# Patient Record
Sex: Female | Born: 1960 | Race: White | Hispanic: No | Marital: Married | Smoking: Never smoker
Health system: Southern US, Community
[De-identification: ages and names within clinical notes are randomized; demographics above are authoritative.]

## PROBLEM LIST (undated history)

## (undated) DIAGNOSIS — I871 Compression of vein: Secondary | ICD-10-CM

## (undated) DIAGNOSIS — Q211 Atrial septal defect: Secondary | ICD-10-CM

## (undated) DIAGNOSIS — F3181 Bipolar II disorder: Secondary | ICD-10-CM

## (undated) HISTORY — DX: Bipolar II disorder: F31.81

## (undated) HISTORY — DX: Compression of vein: I87.1

## (undated) HISTORY — PX: ABDOMINAL HYSTERECTOMY: SHX81

## (undated) HISTORY — DX: Atrial septal defect: Q21.1

---

## 2010-11-03 LAB — HM COLONOSCOPY

## 2017-04-05 ENCOUNTER — Ambulatory Visit: Payer: Self-pay | Admitting: Physician Assistant

## 2017-04-08 ENCOUNTER — Ambulatory Visit (INDEPENDENT_AMBULATORY_CARE_PROVIDER_SITE_OTHER): Payer: BLUE CROSS/BLUE SHIELD

## 2017-04-08 ENCOUNTER — Encounter: Payer: Self-pay | Admitting: Physician Assistant

## 2017-04-08 ENCOUNTER — Ambulatory Visit (INDEPENDENT_AMBULATORY_CARE_PROVIDER_SITE_OTHER): Payer: BLUE CROSS/BLUE SHIELD | Admitting: Physician Assistant

## 2017-04-08 VITALS — BP 114/78 | HR 68 | Ht 61.0 in | Wt 136.0 lb

## 2017-04-08 DIAGNOSIS — F3181 Bipolar II disorder: Secondary | ICD-10-CM | POA: Diagnosis not present

## 2017-04-08 DIAGNOSIS — Q211 Atrial septal defect, unspecified: Secondary | ICD-10-CM

## 2017-04-08 DIAGNOSIS — K769 Liver disease, unspecified: Secondary | ICD-10-CM | POA: Diagnosis not present

## 2017-04-08 DIAGNOSIS — Z7689 Persons encountering health services in other specified circumstances: Secondary | ICD-10-CM

## 2017-04-08 DIAGNOSIS — K7689 Other specified diseases of liver: Secondary | ICD-10-CM

## 2017-04-08 DIAGNOSIS — R109 Unspecified abdominal pain: Secondary | ICD-10-CM

## 2017-04-08 DIAGNOSIS — I871 Compression of vein: Secondary | ICD-10-CM

## 2017-04-08 HISTORY — DX: Atrial septal defect: Q21.1

## 2017-04-08 HISTORY — DX: Atrial septal defect, unspecified: Q21.10

## 2017-04-08 HISTORY — DX: Compression of vein: I87.1

## 2017-04-08 LAB — POCT URINALYSIS DIPSTICK
Bilirubin, UA: NEGATIVE
GLUCOSE UA: NEGATIVE
KETONES UA: NEGATIVE
Leukocytes, UA: NEGATIVE
Nitrite, UA: NEGATIVE
Protein, UA: NEGATIVE
RBC UA: NEGATIVE
SPEC GRAV UA: 1.015 (ref 1.010–1.025)
UROBILINOGEN UA: 0.2 U/dL
pH, UA: 7 (ref 5.0–8.0)

## 2017-04-08 LAB — CBC
HCT: 40.7 % (ref 35.0–45.0)
Hemoglobin: 13 g/dL (ref 11.7–15.5)
MCH: 30.3 pg (ref 27.0–33.0)
MCHC: 31.9 g/dL — AB (ref 32.0–36.0)
MCV: 94.9 fL (ref 80.0–100.0)
MPV: 11.5 fL (ref 7.5–12.5)
Platelets: 188 10*3/uL (ref 140–400)
RBC: 4.29 MIL/uL (ref 3.80–5.10)
RDW: 13.5 % (ref 11.0–15.0)
WBC: 4.1 10*3/uL (ref 3.8–10.8)

## 2017-04-08 LAB — COMPLETE METABOLIC PANEL WITH GFR
ALT: 11 U/L (ref 6–29)
AST: 15 U/L (ref 10–35)
Albumin: 4.3 g/dL (ref 3.6–5.1)
Alkaline Phosphatase: 40 U/L (ref 33–130)
BILIRUBIN TOTAL: 1 mg/dL (ref 0.2–1.2)
BUN: 13 mg/dL (ref 7–25)
CALCIUM: 9.5 mg/dL (ref 8.6–10.4)
CHLORIDE: 104 mmol/L (ref 98–110)
CO2: 28 mmol/L (ref 20–31)
Creat: 0.91 mg/dL (ref 0.50–1.05)
GFR, EST AFRICAN AMERICAN: 83 mL/min (ref >=60)
GFR, Est Non African American: 72 mL/min (ref >=60)
Glucose, Bld: 97 mg/dL (ref 65–99)
Potassium: 4.5 mmol/L (ref 3.5–5.3)
Sodium: 141 mmol/L (ref 135–146)
TOTAL PROTEIN: 6.5 g/dL (ref 6.1–8.1)

## 2017-04-08 LAB — LIPID PANEL W/REFLEX DIRECT LDL
Cholesterol: 219 mg/dL — ABNORMAL HIGH (ref <200)
HDL: 98 mg/dL (ref >50)
LDL-Cholesterol: 103 mg/dL — ABNORMAL HIGH
NON-HDL CHOLESTEROL (CALC): 121 mg/dL (ref <130)
TRIGLYCERIDES: 89 mg/dL (ref <150)
Total CHOL/HDL Ratio: 2.2 Ratio (ref <5.0)

## 2017-04-08 MED ORDER — IOPAMIDOL (ISOVUE-300) INJECTION 61%
100.0000 mL | Freq: Once | INTRAVENOUS | Status: AC | PRN
  Administered 2017-04-08: 100 mL via INTRAVENOUS

## 2017-04-08 MED ORDER — QUETIAPINE FUMARATE 50 MG PO TABS: 50.0000 mg | ORAL_TABLET | Freq: Every day | ORAL | 3 refills | Status: DC

## 2017-04-08 MED ORDER — BUPROPION HCL ER (SR) 150 MG PO TB12: 150.0000 mg | ORAL_TABLET | Freq: Every day | ORAL | 3 refills | Status: DC

## 2017-04-08 NOTE — Progress Notes (Signed)
HPI:                                                                Carrie Ewing is a 56 y.o. female who presents to [facility]: Primary Care Sports Medicine today to establish care   Current Concerns include left-sided swelling  Patient reports asymmetric swelling on her left lower lateral abdomen/"love handle" for a few months. She states it is tender if she applies pressure, but otherwise is asymptomatic. She denies fever, chills, unintended weight loss, change in bowel or bladder habits.   Depression/Anxiety: taking Wellbutrin and Seroquel for Bipolar II without difficulty. Patient reports she has been stable on this regimen for years. Denies symptoms of mania/hypomania. Denies suicidal thinking. Denies auditory/visual hallucinations.  Renal vein stenosis: patient states she has been followed by vascular surgery for this. She has never had stenting or any procedures. Denies history of CKD.  Mammogram - UTD 10/2016 per patient Colonoscopy - UTD 2013 per patient   Health Maintenance Health Maintenance  Topic Date Due  . HIV Screening  09/24/1978  . MAMMOGRAM  09/24/2013  . COLONOSCOPY  09/24/2013  . INFLUENZA VACCINE  05/26/2017  . TETANUS/TDAP  01/26/2027  . Hepatitis C Screening  Completed    Past Medical History:  Diagnosis Date  . Atrial septal defect 04/08/2017  . Bipolar II disorder (HCC)   . Renal vein stenosis 04/08/2017   Past Surgical History:  Procedure Laterality Date  . ABDOMINAL HYSTERECTOMY     fibroids   Social History  Substance Use Topics  . Smoking status: Never Smoker  . Smokeless tobacco: Never Used  . Alcohol use Not on file   family history is not on file.  ROS: negative except as noted in the HPI  Medications: Current Outpatient Prescriptions  Medication Sig Dispense Refill  . aspirin EC 81 MG tablet Take 81 mg by mouth daily.    Marland Kitchen buPROPion (WELLBUTRIN SR) 150 MG 12 hr tablet Take 1 tablet (150 mg total) by mouth  daily. 90 tablet 3  . QUEtiapine (SEROQUEL) 50 MG tablet Take 1 tablet (50 mg total) by mouth daily. 90 tablet 3   No current facility-administered medications for this visit.    No Known Allergies   Objective:  BP 114/78   Pulse 68   Ht 5\' 1"  (1.549 m)   Wt 136 lb (61.7 kg)   BMI 25.70 kg/m  Gen: well-groomed, cooperative, not ill-appearing, no distress HEENT: normal conjunctiva, wearing glasses, EOM's intact  Pulm: Normal work of breathing, normal phonation, clear to auscultation bilaterally CV: Normal rate, regular rhythm, s1 and s2 distinct, no murmurs, clicks or rubs, no carotid bruit GI: abdomen soft, nondistended, nontender, no masses, there is a larger distribution of fat/fullness on the left waistline compared to the right, endorses tenderness when compressed, no CVA tenderness Neuro: alert and oriented x 3, normal tone, no tremor MSK: moving all extremities, normal gait and station, no peripheral edema Skin: warm and dry, no rashes or lesions on exposed skin Psych: normal affect, euthymic mood, normal speech and thought content   No results found for this or any previous visit (from the past 72 hour(s)).   Depression screen [facility] 2/9 04/08/2017  Decreased Interest 0  Down, Depressed, Hopeless 0  PHQ - 2 Score 0     Assessment and Plan: 56 y.o. female with   1. Encounter to establish care - reviewed PMH/PSH - Colonoscopy UTD per patient, requesting outside records - Mammogram UTD per patient, requesting outside records - declines Hepatitis C screening  2. Bipolar II disorder in full remission (HCC) - PHQ2 negative, denies sx of mania/hypomania. Stable - QUEtiapine (SEROQUEL) 50 MG tablet; Take 1 tablet (50 mg total) by mouth daily.  Dispense: 90 tablet; Refill: 3 - buPROPion (WELLBUTRIN SR) 150 MG 12 hr tablet; Take 1 tablet (150 mg total) by mouth daily.  Dispense: 90 tablet; Refill: 3  3. Renal vein stenosis - called and spoke with Abdominal Radiology, Dr.  Kerri PerchesPaff, who recommend CT Abdomen w/contrast to assess patient's acute left lower side pain/swelling in the setting of her renal vein stenosis - CBC - Lipid Panel w/reflex Direct LDL - COMPLETE METABOLIC PANEL WITH GFR - CT ABDOMEN W CONTRAST; Future - Referral to Vascular Surgery for ongoing management/surveillance  4. Acute left flank pain - POCT Urinalysis Dipstick negative - CT ABDOMEN W CONTRAST; Future  Patient education and anticipatory guidance given Patient agrees with treatment plan Follow-up in 3 months for CPE or sooner as needed  Levonne Hubertharley E. Britney Captain PA-C

## 2017-04-09 ENCOUNTER — Encounter: Payer: Self-pay | Admitting: Physician Assistant

## 2017-04-09 ENCOUNTER — Telehealth: Payer: Self-pay

## 2017-04-09 ENCOUNTER — Telehealth: Payer: Self-pay | Admitting: Physician Assistant

## 2017-04-09 DIAGNOSIS — E7800 Pure hypercholesterolemia, unspecified: Secondary | ICD-10-CM | POA: Insufficient documentation

## 2017-04-09 NOTE — Telephone Encounter (Signed)
Pt called stating that she did her own research on liver lesions.  She found that most have lesions on their liver.  She wants to know is it worth her money and time to have the MRI done.  She reports she will cancel MRI for Monday if she does not get a response back by this evening. Please advise. -EH/RMA

## 2017-04-09 NOTE — Progress Notes (Signed)
Your labs look good overall - normal kidney function - normal blood counts - no evidence of diabetes  Total cholesterol is a little high. Recommend diet rich in whole grain, fruit and vegetables and regular cardiovascular exercise.

## 2017-04-09 NOTE — Telephone Encounter (Signed)
Pt notified of recommendations and stated she will proceed with the MRI -EH/RMA

## 2017-04-09 NOTE — Telephone Encounter (Signed)
Called patient and discussed results. Explained there is a lesion in the left lobes of the liver. Explained that radiologist recommends a follow-up MRI of the liver with contrast. Patient expressed understanding. MRI ordered.

## 2017-04-09 NOTE — Telephone Encounter (Signed)
Please let Carrie Ewing know I understand where she is coming from with wanting to be cost-effective and not have an unnecessary imaging study. There is a chance that this liver lesion is benign. However, the radiologist would not have recommended the MRI with contrast if they didn't think this liver lesion could be something more serious, such as a cancer. My medical recommendation is two options: proceed with the MRI or I can refer her to a GI doctor who specializes in this field who could review the images with her and together they could decide on next steps.  Also, please let Carrie ReekBarb know that she should continue to be followed by a Vascular specialist at least annually for her renal vein stenosis and I have placed that referral.

## 2017-04-11 ENCOUNTER — Encounter: Payer: Self-pay | Admitting: Physician Assistant

## 2017-04-16 ENCOUNTER — Encounter: Payer: Self-pay | Admitting: Vascular Surgery

## 2017-04-16 ENCOUNTER — Encounter: Payer: Self-pay | Admitting: Physician Assistant

## 2017-04-16 ENCOUNTER — Ambulatory Visit (INDEPENDENT_AMBULATORY_CARE_PROVIDER_SITE_OTHER): Payer: BLUE CROSS/BLUE SHIELD | Admitting: Vascular Surgery

## 2017-04-16 VITALS — BP 111/74 | HR 74 | Temp 98.1°F | Resp 16 | Ht 61.0 in | Wt 133.0 lb

## 2017-04-16 DIAGNOSIS — I871 Compression of vein: Secondary | ICD-10-CM | POA: Diagnosis not present

## 2017-04-16 NOTE — Progress Notes (Signed)
Patient ID: Carrie FusiBarbara Ewing, female   DOB: 1961/10/05, 56 y.o.   MRN: [id number]  Reason for Consult: New Evaluation (left renal artery stenosis)   Referred by Donzetta Kohutummings, Charley Eliza*  Subjective:     HPI:  Carrie Ewing is a 56 y.o. female mostly healthy female with history of bipolar disorder as well as renal vein stenosis which had previously been followed by a vascular surgeon in [place]. She has never had any intervention for this and has never been symptomatic. This was found incidentally a few years back on CT scanning for lower back pain. She now has no back pain. She does not have any flank pain. She denies any times having urine in her blood and does not have any vulvar pelvic Varicosities. This time she has no complaints related to today's visit.  Past Medical History:  Diagnosis Date  . Atrial septal defect 04/08/2017  . Bipolar II disorder (HCC)   . 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 Past Surgical History:  Procedure Laterality Date  . ABDOMINAL HYSTERECTOMY     fibroids    Short Social History:  Social History  Substance Use Topics  . Smoking status: Never Smoker  . Smokeless tobacco: Never Used  . Alcohol use 0.6 oz/week    1 Glasses of wine per week     Comment: weekends    No Known Allergies  Current Outpatient Prescriptions  Medication Sig Dispense Refill  . aspirin EC 81 MG tablet Take 81 mg by mouth daily.    Marland Kitchen. buPROPion (WELLBUTRIN SR) 150 MG 12 hr tablet Take 1 tablet (150 mg total) by mouth daily. 90 tablet 3  . QUEtiapine (SEROQUEL) 50 MG tablet Take 1 tablet (50 mg total) by mouth daily. 90 tablet 3   No current facility-administered medications for this visit.     Review of Systems  Constitutional:  Constitutional negative. Eyes: Eyes negative.  Respiratory: Respiratory negative.  Cardiovascular: Cardiovascular negative.  GI: Gastrointestinal negative.  GU: Genitourinary negative. Musculoskeletal:  Positive for back pain.  Skin: Skin negative.  Hematologic: Hematologic/lymphatic negative.  Psychiatric: Positive for depressed mood.        Objective:  Objective   Vitals:   04/16/17 1126  BP: 111/74  Pulse: 74  Resp: 16  Temp: 98.1 F (36.7 C)  SpO2: 95%  Weight: 133 lb (60.3 kg)  Height: 5\' 1"  (1.549 m)   Body mass index is 25.13 kg/m.  Physical Exam  Constitutional: She appears well-developed.  HENT:  Head: Normocephalic.  Eyes: Pupils are equal, round, and reactive to light.  Neck: Normal range of motion.  Cardiovascular: Normal rate and regular rhythm.   Pulses:      Radial pulses are 2+ on the right side, and 2+ on the left side.       Popliteal pulses are 2+ on the right side, and 2+ on the left side.       Dorsalis pedis pulses are 2+ on the right side, and 2+ on the left side.       Posterior tibial pulses are 2+ on the right side, and 2+ on the left side.  Abdominal: Soft. She exhibits no mass.  Musculoskeletal: Normal range of motion. She exhibits no edema.  Neurological: She is alert.  Skin: Skin is warm and dry.  Psychiatric: She has a normal mood and affect. Her behavior is normal. Judgment and thought content normal.    Data: IMPRESSION: There is narrowing of the left  renal vein as it courses inferior to the superior mesenteric artery. There is reflux of contrast into the left gonadal vein.      Assessment/Plan:     56 year old female with a history of left renal vein stenosis by CT scan. She by all accounts appears to have never had a symptoms from this. She does have an enlarged collateral vein on the left is also asymptomatic from this. This point I cannot recommend any intervention. I have offered follow-up in 2 years with ultrasound or to just call should she ever develops symptoms of flank pain and hematuria or pelvic varicosities. She is elected to do that. We'll see her on a when necessary basis.     Maeola Harman  MD [facility]

## 2017-04-19 ENCOUNTER — Encounter: Payer: Self-pay | Admitting: Physician Assistant

## 2017-04-19 ENCOUNTER — Telehealth: Payer: Self-pay

## 2017-04-19 ENCOUNTER — Ambulatory Visit (INDEPENDENT_AMBULATORY_CARE_PROVIDER_SITE_OTHER): Payer: BLUE CROSS/BLUE SHIELD

## 2017-04-19 DIAGNOSIS — K769 Liver disease, unspecified: Secondary | ICD-10-CM

## 2017-04-19 DIAGNOSIS — K7689 Other specified diseases of liver: Secondary | ICD-10-CM

## 2017-04-19 MED ORDER — GADOXETATE DISODIUM 0.25 MMOL/ML IV SOLN
6.0000 mL | Freq: Once | INTRAVENOUS | Status: AC | PRN
  Administered 2017-04-19: 6 mL via INTRAVENOUS

## 2017-04-19 NOTE — Telephone Encounter (Signed)
Medication is correct as written. Once a day

## 2017-04-19 NOTE — Telephone Encounter (Signed)
Pt pharmacy called asking for clarification on pt wellbutrin. Pt was given SR which is a twice a day medication. Would like to know if she should be on XR or is she to take 1 tab BID. Please clarify.

## 2017-04-19 NOTE — Progress Notes (Signed)
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 This  does not have any cancer potential No further follow-up necessary

## 2017-04-20 NOTE — Telephone Encounter (Signed)
Pharmacy notified.

## 2017-04-22 ENCOUNTER — Telehealth: Payer: Self-pay | Admitting: Physician Assistant

## 2017-04-22 MED ORDER — BUPROPION HCL ER (XL) 150 MG PO TB24: 150.0000 mg | ORAL_TABLET | Freq: Every day | ORAL | 3 refills | Status: DC

## 2017-04-22 NOTE — Telephone Encounter (Signed)
Pt came into clinic today regarding her Wellbutrin Rx. States she has been trying for days to get this Rx corrected, she now only has one tab left. Pt reports she has been on extended release Wellbutrin "for years" and the last Rx that was sent in was short release.   Pt would like Rx changed back to extended release, one tab daily. New Rx pended for PCP review.

## 2017-04-22 NOTE — Telephone Encounter (Signed)
Corrected and sent

## 2017-06-29 ENCOUNTER — Other Ambulatory Visit: Payer: Self-pay | Admitting: Physician Assistant

## 2017-06-29 DIAGNOSIS — Z1239 Encounter for other screening for malignant neoplasm of breast: Secondary | ICD-10-CM

## 2017-06-30 ENCOUNTER — Ambulatory Visit (INDEPENDENT_AMBULATORY_CARE_PROVIDER_SITE_OTHER): Payer: BLUE CROSS/BLUE SHIELD

## 2017-06-30 DIAGNOSIS — Z1231 Encounter for screening mammogram for malignant neoplasm of breast: Secondary | ICD-10-CM

## 2017-06-30 DIAGNOSIS — Z1239 Encounter for other screening for malignant neoplasm of breast: Secondary | ICD-10-CM

## 2017-07-22 NOTE — Progress Notes (Signed)
Normal mammogram  Recommend repeat in 1 year

## 2018-02-24 ENCOUNTER — Other Ambulatory Visit: Payer: Self-pay | Admitting: Physician Assistant

## 2018-02-25 ENCOUNTER — Ambulatory Visit (INDEPENDENT_AMBULATORY_CARE_PROVIDER_SITE_OTHER): Payer: BLUE CROSS/BLUE SHIELD | Admitting: Physician Assistant

## 2018-02-25 ENCOUNTER — Encounter: Payer: Self-pay | Admitting: Physician Assistant

## 2018-02-25 VITALS — BP 121/83 | HR 73 | Wt 125.0 lb

## 2018-02-25 DIAGNOSIS — F3181 Bipolar II disorder: Secondary | ICD-10-CM | POA: Diagnosis not present

## 2018-02-25 MED ORDER — QUETIAPINE FUMARATE 100 MG PO TABS: 100.0000 mg | ORAL_TABLET | Freq: Every day | ORAL | 5 refills | Status: AC

## 2018-02-25 NOTE — Patient Instructions (Addendum)
Other resources: - BelizeBus.at - mindbodygreen.com - 7cupsoftea - https://malone.com/  Safety Plan: if having self-harm or suicidal thoughts 1. Tom 2. [place] [phone] 3. Our Office 614-624-6944 4. [facility] Crisis Hotline 210-084-3329 5. National Suicide Hotline 1-800-SUICIDE If in immediate danger of harming yourself, go to the nearest emergency room or call 911  Counseling: - psychologytoday.com: search engine to locate local counselors - Family Services in your county offer counseling on a sliding scale (pay what you can afford) - [facility]: we can place a referral for you to see one of licensed counselors in [place], [place], or [place] - online counseling: BetterHelp and Art therapist (not covered by insurance, but affordable self-pay rates)

## 2018-02-25 NOTE — Progress Notes (Signed)
HPI:                                                                Carrie Ewing is a 57 y.o. female who presents to [facility]: Primary Care Sports Medicine today for medication management  Bipolar II: has been taking Wellbutrin XL 150 mg and Seroquel  nightly for Bipolar II without difficulty for years. For the last 4-6 weeks she has had increased tearfulness, depressed mood and irritability. She also endorses passive SI described as feeling like everyone would be better off without her (no plan). States her faith keeps her from acting on these thoughts. She is not sleeping well at night and has worsening anxiety in the evenings. No history of hospitalizations. History of prescription drug overdose in high school. Denies symptoms of mania/hypomania. Denies suicidal thinking. Denies auditory/visual hallucinations.  Depression screen [facility] 2/9 02/25/2018 04/08/2017  Decreased Interest 3 0  Down, Depressed, Hopeless 3 0  PHQ - 2 Score 6 0  Altered sleeping 3 -  Tired, decreased energy 3 -  Change in appetite 0 -  Feeling bad or failure about yourself  3 -  Trouble concentrating 3 -  Moving slowly or fidgety/restless 2 -  Suicidal thoughts 3 -  PHQ-9 Score 23 -  Difficult doing work/chores Very difficult -    GAD 7 : Generalized Anxiety Score 02/25/2018  Nervous, Anxious, on Edge 3  Control/stop worrying 2  Worry too much - different things 2  Trouble relaxing 2  Restless 2  Easily annoyed or irritable 3  Afraid - awful might happen 3  Total GAD 7 Score 17  Anxiety Difficulty Very difficult      Past Medical History:  Diagnosis Date  . Atrial septal defect 04/08/2017  . Bipolar II disorder (HCC)   . Renal vein stenosis 04/08/2017   Past Surgical History:  Procedure Laterality Date  . ABDOMINAL HYSTERECTOMY     fibroids   Social History   Tobacco Use  . Smoking status: Never Smoker  . Smokeless tobacco: Never Used  Substance Use Topics  .  Alcohol use: Yes    Alcohol/week: 0.6 oz    Types: 1 Glasses of wine per week    Comment: weekends   family history is not on file.    ROS: negative except as noted in the HPI  Medications: Current Outpatient Medications  Medication Sig Dispense Refill  . aspirin EC 81 MG tablet Take 81 mg by mouth daily.    Marland Kitchen buPROPion (WELLBUTRIN XL) 150 MG 24 hr tablet Take 1 tablet (150 mg total) by mouth daily. Due for follow up visit 30 tablet 0  . QUEtiapine (SEROQUEL) 100 MG tablet Take 1 tablet (100 mg total) by mouth at bedtime. 30 tablet 5   No current facility-administered medications for this visit.    No Known Allergies     Objective:  BP 121/83   Pulse 73   Wt 125 lb (56.7 kg)   BMI 23.62 kg/m  Gen:  alert, not ill-appearing, no distress, appropriate for age HEENT: head normocephalic without obvious abnormality, conjunctiva and cornea clear, trachea midline Pulm: Normal work of breathing, normal phonation Neuro: alert and oriented x 3, no tremor MSK: extremities atraumatic, normal gait and station Skin:  intact, no rashes on exposed skin, no jaundice, no cyanosis Psych: well-groomed, cooperative, good eye contact, depressed mood, tearful, affect mood-congruent, speech is articulate, and thought processes clear and goal-directed    No results found for this or any previous visit (from the past 72 hour(s)). No results found.    Assessment and Plan: 57 y.o. female with   1. Bipolar 2 disorder, major depressive episode (HCC) - PHQ9=23, severe, no psychosis.  - patient verbally contracted for safety and safety plan reviewed - increasing Seroquel to 100 mg nightly. Keeping Wellbutrin at 150 for now; do not want to worsen irritability - Ambulatory referral to Psychiatry for medication management and counseling     Patient education and anticipatory guidance given Patient agrees with treatment plan Follow-up in 1 week or sooner as needed if symptoms worsen or fail to  improve  Levonne Hubert PA-C

## 2018-03-04 ENCOUNTER — Ambulatory Visit: Payer: BLUE CROSS/BLUE SHIELD | Admitting: Physician Assistant

## 2018-03-08 ENCOUNTER — Encounter (HOSPITAL_COMMUNITY): Payer: Self-pay | Admitting: Psychiatry

## 2018-03-08 ENCOUNTER — Ambulatory Visit (INDEPENDENT_AMBULATORY_CARE_PROVIDER_SITE_OTHER): Payer: BLUE CROSS/BLUE SHIELD | Admitting: Psychiatry

## 2018-03-08 ENCOUNTER — Other Ambulatory Visit: Payer: Self-pay

## 2018-03-08 VITALS — BP 110/72 | HR 66 | Ht 61.0 in | Wt 125.0 lb

## 2018-03-08 DIAGNOSIS — G4733 Obstructive sleep apnea (adult) (pediatric): Secondary | ICD-10-CM | POA: Diagnosis not present

## 2018-03-08 DIAGNOSIS — Z915 Personal history of self-harm: Secondary | ICD-10-CM | POA: Diagnosis not present

## 2018-03-08 DIAGNOSIS — Z630 Problems in relationship with spouse or partner: Secondary | ICD-10-CM

## 2018-03-08 DIAGNOSIS — Z818 Family history of other mental and behavioral disorders: Secondary | ICD-10-CM

## 2018-03-08 DIAGNOSIS — Z62811 Personal history of psychological abuse in childhood: Secondary | ICD-10-CM | POA: Diagnosis not present

## 2018-03-08 DIAGNOSIS — F3181 Bipolar II disorder: Secondary | ICD-10-CM

## 2018-03-08 DIAGNOSIS — F1099 Alcohol use, unspecified with unspecified alcohol-induced disorder: Secondary | ICD-10-CM

## 2018-03-08 MED ORDER — LAMOTRIGINE 25 MG PO TABS: 25.0000 mg | ORAL_TABLET | Freq: Every day | ORAL | 0 refills | Status: DC

## 2018-03-08 NOTE — Progress Notes (Signed)
Psychiatric Initial Adult Assessment   Patient Identification: Carrie Ewing MRN:  [id number] Date of Evaluation:  03/08/2018 Referral Source: [facility] Primary care office Chief Complaint:   Chief Complaint    Establish Care; Other     Visit Diagnosis:    ICD-10-CM   1. Bipolar 2 disorder, major depressive episode (HCC) F31.81      History of Present Illness:  57 years old white married female. Referred for management of mood symptoms including depression diagnosis of possible bipolar disorder  Patient has moved from [place] to live near her daughter. 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 She has been doing reasonably Wellbutrin at a dose of 150 mg and cervical dose of 5200 mg for sleep but apparently she is now feeling that Wellbutrin is probably not enough and is having episodes including depression and hopelessness. That at times lead to irritability and mind racing and getting agitated easily.  Denies drug use only sporadic or we can use of wine  Difficult childhood growing up with mom and dad she was the youngest of 6 siblings which were half siblings mom always gave her an indication that she was never wanted and she has had difficult childhood because they were also very poor and she has seen a lot of fights among the parents when she was growing up.  When she was in high school she has overdosed one time because of an argument with her mom but asked that she has never been admitted in the hospital low suicide attempt  Modifying factors; her daughter is her grand kids Aggravating  factors; at times her husband related to his sex addiction but he is following treatment as of now. The location  Severity of depression; 5 out of 10. 10 being no depression    Timing; more than 10 years  Associated Signs/Symptoms: Depression Symptoms:  depressed mood, psychomotor agitation, disturbed sleep, (Hypo) Manic Symptoms:  Distractibility, Labiality of Mood, Anxiety Symptoms:  Excessive Worry, but tries to not dwell on it and gets busy Psychotic Symptoms:  denies PTSD Symptoms: Had a traumatic exposure:  difficult chiodhood Hypervigilance:  Yes  Past Psychiatric History: iagnosed with depression and bipolar disorder  Previous Psychotropic Medications: Yes   Substance Abuse History in the last 12 months:  No.  Consequences of Substance Abuse: NA  Past Medical History:  Past Medical History:  Diagnosis Date  . Atrial septal defect 04/08/2017  . Bipolar II disorder (HCC)   . Renal vein stenosis 04/08/2017    Past Surgical History:  Procedure Laterality Date  . ABDOMINAL HYSTERECTOMY     fibroids    Family Psychiatric History: sister; depression  Family History: History reviewed. No pertinent family history.  Social History:   Social History   Socioeconomic History  . Marital status: Married    Spouse name: Not on file  . Number of children: Not on file  . Years of education: Not on file  . Highest education level: Not on file  Occupational History  . Not on file  Social Needs  . Financial resource strain: Not on file  . Food insecurity:  Worry: Not on file    Inability: Not on file  . Transportation needs:    Medical: Not on file    Non-medical: Not on file  Tobacco Use  . Smoking status: Never Smoker  . Smokeless tobacco: Never Used  Substance and Sexual Activity  . Alcohol use: Yes    Alcohol/week: 0.6 oz    Types: 1 Glasses of wine per week    Comment: weekends  . Drug use: No  . Sexual activity: Yes    Birth control/protection:  Surgical  Lifestyle  . Physical activity:    Days per week: Not on file    Minutes per session: Not on file  . Stress: Not on file  Relationships  . Social connections:    Talks on phone: Not on file    Gets together: Not on file    Attends religious service: Not on file    Active member of club or organization: Not on file    Attends meetings of clubs or organizations: Not on file    Relationship status: Not on file  Other Topics Concern  . Not on file  Social History Narrative  . Not on file    Additional Social History: grew up with her parents difficult going up because of being the youngest and her half siblings are all older. Mom and dad always fight. 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 aspirin EC 81 MG tablet Take 81 mg by mouth daily.    Marland Kitchen buPROPion (WELLBUTRIN XL) 150 MG 24 hr tablet Take 1 tablet (150 mg total) by mouth daily. Due for follow up visit 30 tablet 0  . Multiple Vitamin (MULTIVITAMIN) tablet Take 1 tablet by mouth daily.    . QUEtiapine (SEROQUEL) 100 MG tablet Take 1 tablet (100 mg total) by mouth at bedtime. 30 tablet 5  . lamoTRIgine (LAMICTAL) 25 MG tablet Take 1 tablet (25 mg total) by mouth daily. Take one tablet daily for a week and then start taking 2 tablets. 60 tablet 0   No current facility-administered medications for this visit.     Neurologic: Headache: No Seizure:  No Paresthesias:No  Musculoskeletal: Strength & Muscle Tone: within normal limits Gait & Station: normal Patient leans: no lean  Psychiatric Specialty Exam: Review of Systems  Cardiovascular: Negative for chest pain.  Skin: Negative for rash.  Neurological: Negative for tremors.  Psychiatric/Behavioral: Positive for depression.    Blood pressure 110/72, pulse 66, height  (1.549 m), weight 125 lb (56.7 kg).Body mass index is 23.62 kg/m.  General Appearance: Casual  Eye Contact:  Fair  Speech:  Normal Rate  Volume:  Decreased  Mood:  Dysphoric  Affect:  Congruent  Thought Process:  Goal Directed  Orientation:  Full (Time, Place, and Person)  Thought Content:  Logical and Rumination  Suicidal Thoughts:  No  Homicidal Thoughts:  No  Memory:  Immediate;   Fair Recent;   Fair  Judgement:  Fair  Insight:  Fair  Psychomotor Activity:  Normal  Concentration:  Concentration: Fair and Attention Span: Fair  Recall:  [employer] of Knowledge:Fair  Language: Fair  Akathisia:  Negative  Handed:  Right  AIMS (if indicated):    Assets:  Desire for Improvement  ADL's:  Intact  Cognition: WNL  Sleep:  fair    Treatment Plan Summary: Medication management and Plan as follows  1. Bipolar disorder, mixed or depressed episode: continue seroquel at night. Does not want to increase says it makes her sleepy On wellbutrin will not increse due to agitation Ill add lamictal  increase to 50 in one week  2. Insomnia; low dose seroquel as above  3. Relationship concerns: husband addiction and has had difficult dealing with and he has been going in therapy till relocation here. Now has found one place in [place] Will refer to therapy to cope and work on this stressor which has been unpredictalbe.  More than 50% time spent in counseling and coordination of care including patient education and review side effects and concerns were addressed  Follow-up in 3-4 weeks or earlier if  needed   Thresa Ross, MD 5/14/20199:20 AM

## 2018-03-24 ENCOUNTER — Other Ambulatory Visit: Payer: Self-pay | Admitting: Physician Assistant

## 2018-03-31 ENCOUNTER — Ambulatory Visit (HOSPITAL_COMMUNITY): Payer: Self-pay | Admitting: Licensed Clinical Social Worker

## 2018-04-04 ENCOUNTER — Ambulatory Visit (HOSPITAL_COMMUNITY): Payer: Self-pay | Admitting: Psychiatry

## 2018-04-19 ENCOUNTER — Other Ambulatory Visit: Payer: Self-pay

## 2018-04-19 ENCOUNTER — Telehealth (HOSPITAL_COMMUNITY): Payer: Self-pay | Admitting: Psychiatry

## 2018-04-19 ENCOUNTER — Other Ambulatory Visit (HOSPITAL_COMMUNITY): Payer: Self-pay

## 2018-04-19 ENCOUNTER — Telehealth: Payer: Self-pay

## 2018-04-19 MED ORDER — BUPROPION HCL ER (XL) 150 MG PO TB24: 150.0000 mg | ORAL_TABLET | Freq: Every day | ORAL | 0 refills | Status: AC

## 2018-04-19 MED ORDER — LAMOTRIGINE 25 MG PO TABS: 25.0000 mg | ORAL_TABLET | Freq: Every day | ORAL | 0 refills | Status: AC

## 2018-04-19 NOTE — Telephone Encounter (Signed)
Spoke to patient. Informed her that both rx for lamictal and wellburtin has been sent to pharmacy. They should be ready for her to pick up. Informed patient that for the next refill we will need her to have a follow up appointment. Informed her that we could bill her for any copay that she might have, we do not want the finical reasons to stop her for seeking help she might need.  Patient stated "she has been on this medication for over 10 years and there is no reason to discontinue it. It will be on our heads when she commits suicide"   Consulted Dr. Milana KidneyHoover about this. She states that it was no reason to do a well check at this time.   Will [facility] this to [facility] and Dr. [facility].  Gilmore Larochekhtar will return to the office on Thursday 6/27.

## 2018-04-19 NOTE — Telephone Encounter (Signed)
Patient called back and is requesting a refill on her Wellbutrin. Please advise.

## 2018-04-19 NOTE — Telephone Encounter (Signed)
Pt notified of recommendations.  She said that she can't afford to come in to f/u with PCP.  She is requesting a refill of Wellbutrin.  I explained to patient that Carrie Ewing would like for her to f/u.  While on the phone patient stated " If she doesn't refill my Wellbutrin I will kill myself and that will be on her. She knows my situation."  [place] notified of phone conversation. Carrie Ewing approved a 30 day supply.  Refill sent to [facility] pharmacy. -EH/RMA

## 2018-04-19 NOTE — Telephone Encounter (Signed)
Left message with PCP recommendations. Patient was asked to call back with any questions.

## 2018-04-19 NOTE — Telephone Encounter (Signed)
Called and spoke with patient for safety check. Patient continues to threaten that she cannot promise she will not harm herself if she is not given her medications. I asked patient if she is safe right now and intending to harm herself. She states she is doing great right now, but she won't make any promises regarding 2 days from now when she runs out of medication. Explained to her that this does not sound like she is stable or that it is safe to give her a 1 year supply of medication without follow-up Patient insists she cannot financially afford to see myself or Dr. Gilmore LarocheAkhtar for follow-up Explained to patient that given her recent depressive episode, medication changes, and now suicidal threats, it is not safe to refill her medication without follow-up.  I sent in a 30 day supply and told her she will need to make arrangements for a follow-up appointment with either Dr. Gilmore LarocheAkhtar or myself or establish with a new provider within this time period. Encouraged patient to seek emergency care for worsening suicidal ideation. Patient expressed understanding and hung up.

## 2018-04-19 NOTE — Telephone Encounter (Signed)
She should request from Dr. Lavella LemonsAkhtar's office or she can follow-up with me in the office. Needs follow-up for refills either way due to recent medication change

## 2018-04-19 NOTE — Telephone Encounter (Signed)
Pt is asking that you refill her lamictal.  This was filled by behavior health. Please advise. -EH/RMA

## 2018-04-21 NOTE — Telephone Encounter (Signed)
Follow ups are needed for evaluation of new medication. She is on lamictal can continue, can report to ED for concerns. Should consider therapy to deal with her impulse control.  Appointments should be encouraged . Can send refill which were already done for now. Threatening suicide and to blame on providers need be dealt with in therapy as well.  Can continue current meds for now

## 2018-06-03 ENCOUNTER — Other Ambulatory Visit: Payer: Self-pay | Admitting: Physician Assistant

## 2018-06-03 DIAGNOSIS — Z1231 Encounter for screening mammogram for malignant neoplasm of breast: Secondary | ICD-10-CM

## 2018-07-13 ENCOUNTER — Ambulatory Visit (INDEPENDENT_AMBULATORY_CARE_PROVIDER_SITE_OTHER): Payer: BLUE CROSS/BLUE SHIELD

## 2018-07-13 ENCOUNTER — Other Ambulatory Visit: Payer: Self-pay | Admitting: Family Medicine

## 2018-07-13 DIAGNOSIS — Z1231 Encounter for screening mammogram for malignant neoplasm of breast: Secondary | ICD-10-CM | POA: Diagnosis not present

## 2018-12-18 ENCOUNTER — Other Ambulatory Visit: Payer: Self-pay

## 2018-12-18 ENCOUNTER — Encounter: Payer: Self-pay | Admitting: Emergency Medicine

## 2018-12-18 ENCOUNTER — Emergency Department (INDEPENDENT_AMBULATORY_CARE_PROVIDER_SITE_OTHER)
Admission: EM | Admit: 2018-12-18 | Discharge: 2018-12-18 | Disposition: A | Discharge status: Home / Self Care | Payer: BLUE CROSS/BLUE SHIELD | Source: Home / Self Care

## 2018-12-18 DIAGNOSIS — M545 Low back pain, unspecified: Secondary | ICD-10-CM

## 2018-12-18 MED ORDER — PREDNISONE 20 MG PO TABS: ORAL_TABLET | ORAL | 0 refills | Status: AC

## 2018-12-18 MED ORDER — MELOXICAM 15 MG PO TABS: 15.0000 mg | ORAL_TABLET | Freq: Every day | ORAL | 0 refills | Status: AC

## 2018-12-18 MED ORDER — CYCLOBENZAPRINE HCL 5 MG PO TABS: 5.0000 mg | ORAL_TABLET | Freq: Two times a day (BID) | ORAL | 0 refills | Status: AC | PRN

## 2018-12-18 NOTE — ED Provider Notes (Signed)
[facility]    CSN: [id number] Arrival date & time: 12/18/18  1641     History   Chief Complaint Chief Complaint  Patient presents with  . Hip Pain    HPI Carrie Ewing is a 58 y.o. female.   HPI  Carrie Ewing is a 58 y.o. female presenting to UC with hx ofsacroiliac pain c/o exacerbation of chronic lower back pain.she had a steroid injection about 3 years ago but started having worsening pain again over the last few weeks.  Pain is worse walking and sitting in certain positions.  She is new to the area. She has a new patient appointment with her PCP in about 2 weeks in hopes of being referred to an anesthesiologist to get another spinal injection.  She is requesting pain relief in the meantime.  She does not want to take any narcotic medications as those cause severe stomach upset. She has done well with flexeril and meloxicam in the past. Denies fever, chills, urinary symptoms or weakness or numbness in arms or legs.    Past Medical History:  Diagnosis Date  . Atrial septal defect 04/08/2017  . Bipolar II disorder (HCC)   . 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 Bipolar 2 disorder, major depressive episode (HCC) 02/25/2018  . 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 Hypercholesterolemia 04/09/2017  . Bipolar II disorder in full remission (HCC) 04/08/2017  . 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 Atrial septal defect 04/08/2017    Past Surgical History:  Procedure Laterality Date  . ABDOMINAL HYSTERECTOMY     fibroids    OB History   No obstetric history on file.      Home Medications    Prior to Admission medications   Medication Sig Start Date End Date Taking? Authorizing Provider  aspirin EC 81 MG tablet Take 81 mg by mouth daily.    [provider]  buPROPion (WELLBUTRIN XL) 150 MG 24 hr tablet Take 1 tablet (150 mg total) by mouth daily. Due for follow up visit 04/19/18   Carlis Stable, PA-C  cyclobenzaprine (FLEXERIL) 5 MG tablet Take 1-2 tablets (5-10 mg total) by mouth 2 (two) times daily as needed for muscle spasms. 12/18/18   Lurene Shadow, PA-C  lamoTRIgine (LAMICTAL) 25 MG tablet Take 1 tablet (25 mg total) by mouth daily. Take one tablet daily for a week and then start taking 2 tablets. 04/19/18   Thresa Ross, MD  meloxicam (MOBIC) 15 MG tablet Take 1 tablet (15 mg total) by mouth daily. 12/18/18   Lurene Shadow, PA-C  Multiple Vitamin (MULTIVITAMIN) tablet Take 1 tablet by mouth daily.    [provider]  predniSONE (DELTASONE) 20 MG tablet 3 tabs po day one, then 2 po daily x 4 days 12/18/18   Lurene Shadow, PA-C  QUEtiapine (SEROQUEL) 100 MG tablet Take 1 tablet (100 mg total) by mouth at bedtime. 02/25/18   Carlis Stable, PA-C    Family History No family history on file.  Social History Social History   Tobacco Use  . Smoking status: Never Smoker  . Smokeless tobacco: Never Used  Substance Use Topics  . Alcohol use: Yes    Alcohol/week: 1.0 standard drinks    Types: 1 Glasses of wine per week    Comment: weekends  . Drug use: No     Allergies   Patient has no known allergies.  Review of Systems Review of Systems  Genitourinary: Negative for dysuria, flank pain and frequency.  Musculoskeletal: Positive for arthralgias, back pain and myalgias. Negative for gait problem.  Neurological: Negative for weakness and numbness.     Physical Exam Triage Vital Signs ED Triage Vitals  Enc Vitals Group     BP 12/18/18 1712 105/68     Pulse Rate 12/18/18 1712 79     Resp 12/18/18 1712 18     Temp 12/18/18 1712 98 F (36.7 C)     Temp Source 12/18/18 1712 Oral     SpO2 12/18/18 1712 97 %     Weight 12/18/18 1713 120 lb (54.4 kg)     Height 12/18/18 1713 5\' 1"  (1.549 m)     Head Circumference --      Peak Flow --      Pain Score 12/18/18 1712 8     Pain Loc --      Pain Edu? --      Excl. in GC? --     No data found.  Updated Vital Signs BP 105/68   Pulse 79   Temp 98 F (36.7 C) (Oral)   Resp 18   Ht 5\' 1"  (1.549 m)   Wt 120 lb (54.4 kg)   SpO2 97%   BMI 22.67 kg/m   Visual Acuity Right Eye Distance:   Left Eye Distance:   Bilateral Distance:    Right Eye Near:   Left Eye Near:    Bilateral Near:     Physical Exam Vitals signs and nursing note reviewed.  Constitutional:      Appearance: She is well-developed.  HENT:     Head: Normocephalic and atraumatic.  Neck:     Musculoskeletal: Normal range of motion.  Cardiovascular:     Rate and Rhythm: Normal rate.  Pulmonary:     Effort: Pulmonary effort is normal.  Musculoskeletal: Normal range of motion.        General: Tenderness present.     Comments: Tenderness to Right side SI joint and surrounding muscles.  Full ROM bilateral hips w/o crepitus. Normal gait.   Skin:    General: Skin is warm and dry.  Neurological:     Mental Status: She is alert and oriented to person, place, and time.  Psychiatric:        Behavior: Behavior normal.      UC Treatments / Results  Labs (all labs ordered are listed, but only abnormal results are displayed) Labs Reviewed - No data to display  EKG None  Radiology No results found.  Procedures Procedures (including critical care time)  Medications Ordered in UC Medications - No data to display  Initial Impression / Assessment and Plan / UC Course  I have reviewed the triage vital signs and the nursing notes.  Pertinent labs & imaging results that were available during my care of the patient were reviewed by me and considered in my medical decision making (see chart for details).    No red flag symptoms. Will tx pain symptomatically Home care info provided Encouraged f/u with her PCP or Sports Medicine.  Final Clinical Impressions(s) / UC Diagnoses   Final diagnoses:  Acute right-sided low back pain without sciatica     Discharge Instructions       Flexeril (cyclobenzaprine) is a muscle relaxer and may cause drowsiness. Do not drink alcohol, drive, or operate heavy machinery while taking.  Meloxicam (Mobic) is an antiinflammatory to help with pain and  inflammation.  Do not take ibuprofen, Advil, Aleve, or any other medications that contain NSAIDs while taking meloxicam as this may cause stomach upset or even ulcers if taken in large amounts for an extended period of time.   If you have a tendency to get heartburn or have had stomach ulcers in the past, use caution when taking the Meloxicam, especially if you do wind up taking the prednisone with it.  Please follow up with your family doctor this week or call Sports Medicine to schedule a follow up exam.    ED Prescriptions    Medication Sig Dispense Auth. Provider   cyclobenzaprine (FLEXERIL) 5 MG tablet Take 1-2 tablets (5-10 mg total) by mouth 2 (two) times daily as needed for muscle spasms. 30 tablet Lurene Shadow, PA-C   meloxicam (MOBIC) 15 MG tablet Take 1 tablet (15 mg total) by mouth daily. 30 tablet Doroteo Glassman, Naiyana Barbian O, PA-C   predniSONE (DELTASONE) 20 MG tablet 3 tabs po day one, then 2 po daily x 4 days 11 tablet Lurene Shadow, PA-C     Controlled Substance Prescriptions [place] Controlled Substance Registry consulted? Not Applicable   Rolla Plate 12/19/18 1057

## 2018-12-18 NOTE — Discharge Instructions (Signed)
°  Flexeril (cyclobenzaprine) is a muscle relaxer and may cause drowsiness. Do not drink alcohol, drive, or operate heavy machinery while taking.  Meloxicam (Mobic) is an antiinflammatory to help with pain and inflammation.  Do not take ibuprofen, Advil, Aleve, or any other medications that contain NSAIDs while taking meloxicam as this may cause stomach upset or even ulcers if taken in large amounts for an extended period of time.   If you have a tendency to get heartburn or have had stomach ulcers in the past, use caution when taking the Meloxicam, especially if you do wind up taking the prednisone with it.  Please follow up with your family doctor this week or call Sports Medicine to schedule a follow up exam.

## 2018-12-18 NOTE — ED Triage Notes (Signed)
Patient has had sacroiliac pain in past; right side area began feeling weak a couple weeks ago and today walking and sitting are extremely painful. No OTCs or rx taken.

## 2019-07-13 ENCOUNTER — Other Ambulatory Visit: Payer: Self-pay | Admitting: Family Medicine

## 2019-07-13 DIAGNOSIS — Z1231 Encounter for screening mammogram for malignant neoplasm of breast: Secondary | ICD-10-CM

## 2019-08-16 ENCOUNTER — Ambulatory Visit (INDEPENDENT_AMBULATORY_CARE_PROVIDER_SITE_OTHER): Payer: BC Managed Care – PPO

## 2019-08-16 ENCOUNTER — Other Ambulatory Visit: Payer: Self-pay

## 2019-08-16 DIAGNOSIS — Z1231 Encounter for screening mammogram for malignant neoplasm of breast: Secondary | ICD-10-CM | POA: Diagnosis not present

## 2020-08-12 ENCOUNTER — Other Ambulatory Visit: Payer: Self-pay | Admitting: Family Medicine

## 2020-08-12 DIAGNOSIS — Z1231 Encounter for screening mammogram for malignant neoplasm of breast: Secondary | ICD-10-CM

## 2020-08-21 ENCOUNTER — Ambulatory Visit (INDEPENDENT_AMBULATORY_CARE_PROVIDER_SITE_OTHER): Payer: BC Managed Care – PPO

## 2020-08-21 ENCOUNTER — Other Ambulatory Visit: Payer: Self-pay

## 2020-08-21 DIAGNOSIS — Z1231 Encounter for screening mammogram for malignant neoplasm of breast: Secondary | ICD-10-CM

## 2021-08-21 ENCOUNTER — Other Ambulatory Visit: Payer: Self-pay | Admitting: Family Medicine

## 2021-08-21 DIAGNOSIS — Z1231 Encounter for screening mammogram for malignant neoplasm of breast: Secondary | ICD-10-CM

## 2021-08-27 ENCOUNTER — Ambulatory Visit (INDEPENDENT_AMBULATORY_CARE_PROVIDER_SITE_OTHER): Payer: 59

## 2021-08-27 ENCOUNTER — Other Ambulatory Visit: Payer: Self-pay

## 2021-08-27 DIAGNOSIS — Z1231 Encounter for screening mammogram for malignant neoplasm of breast: Secondary | ICD-10-CM

## 2022-05-22 IMAGING — MG MM DIGITAL SCREENING BILAT W/ TOMO AND CAD
8 series · 9 of 24 positions shown · non-contrast
Comparison: Previous exam(s).

CLINICAL DATA: Screening.

EXAM:
DIGITAL SCREENING BILATERAL MAMMOGRAM WITH TOMOSYNTHESIS AND CAD
TECHNIQUE: Bilateral screening digital craniocaudal and mediolateral oblique
mammograms were obtained. Bilateral screening digital breast
tomosynthesis was performed. The images were evaluated with
computer-aided detection.

[L CC synth-2D]
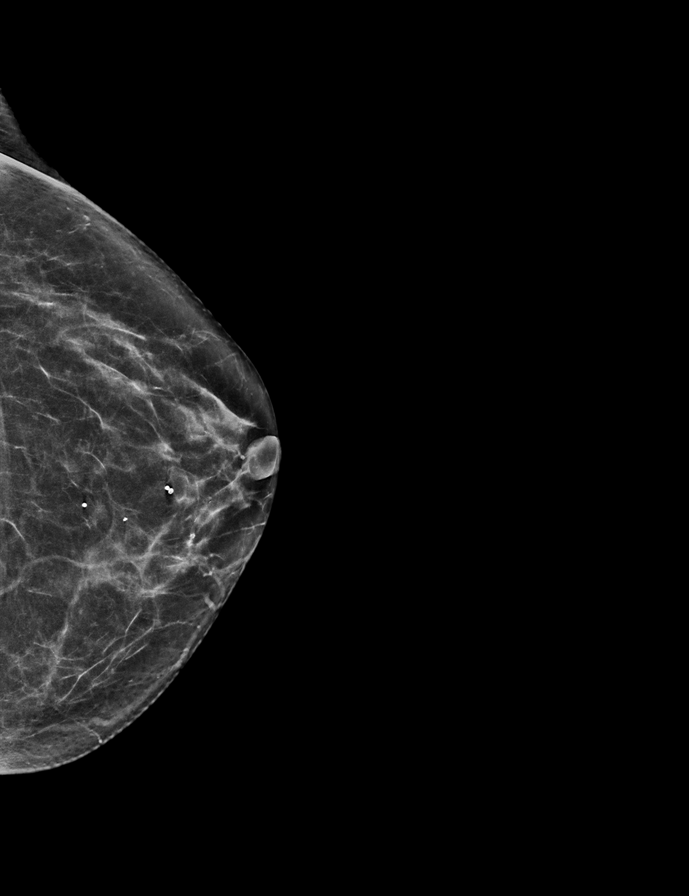

[R MLO synth-2D]
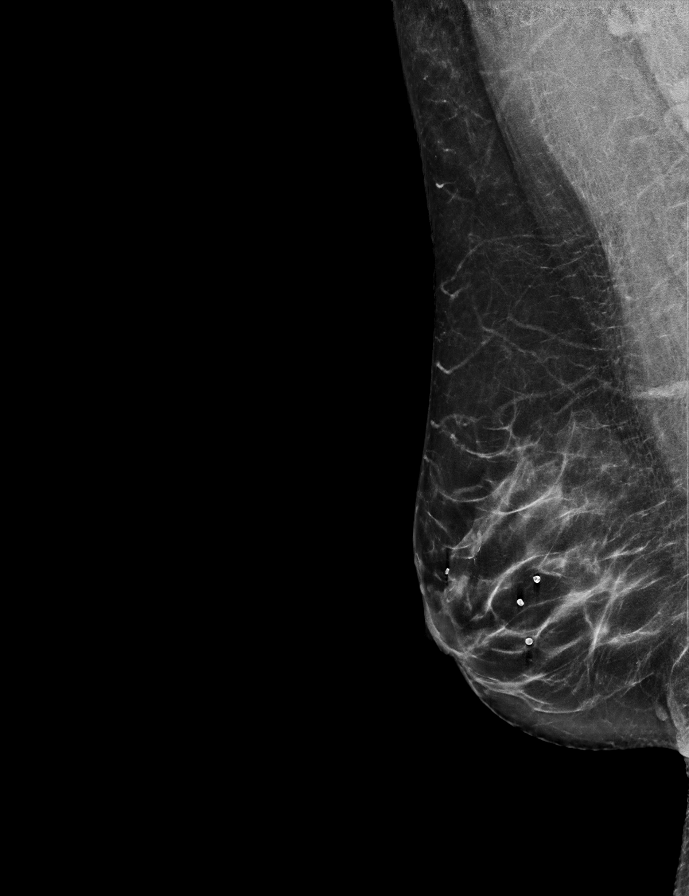

[L MLO synth-2D]
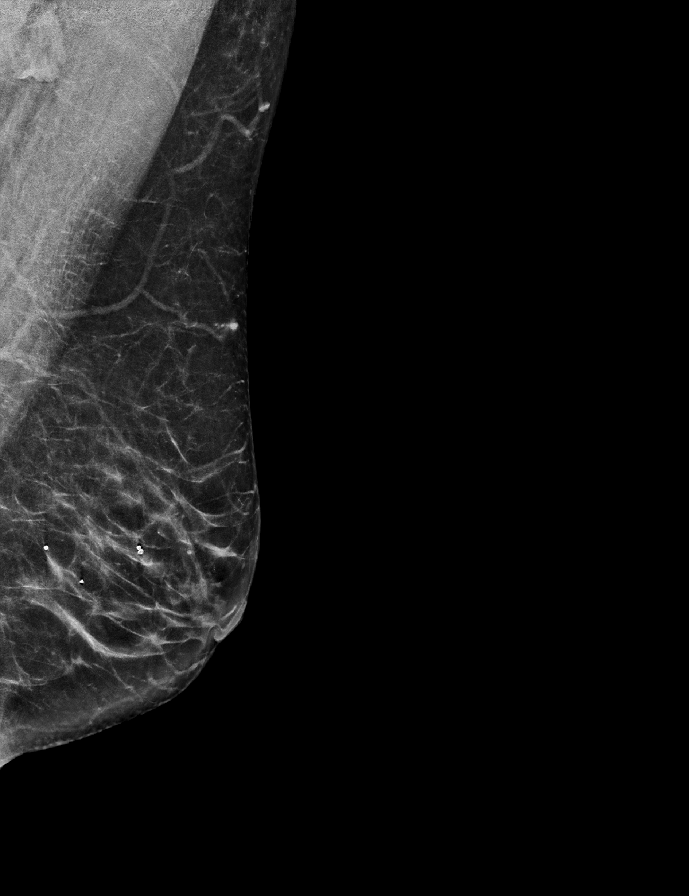

[R CC synth-2D]
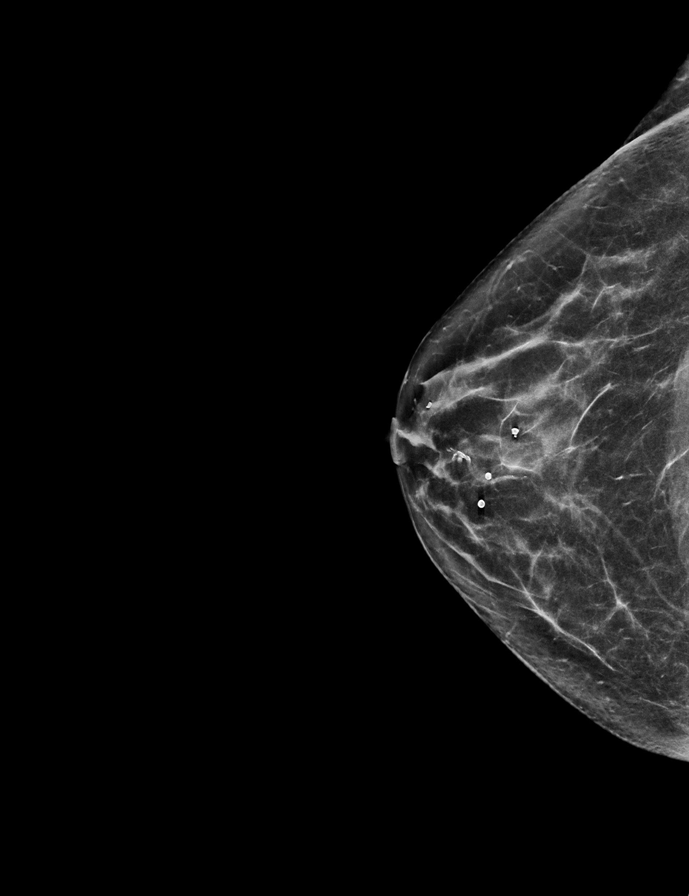

[L MLO tomo · 2 of 64 frames shown]
[frame 21/64]
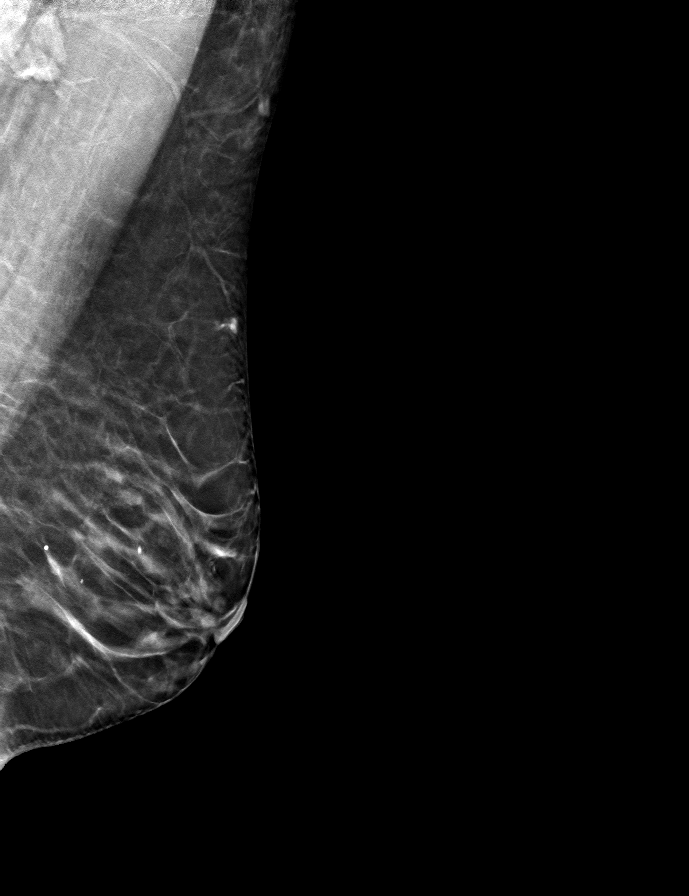
[frame 33/64]
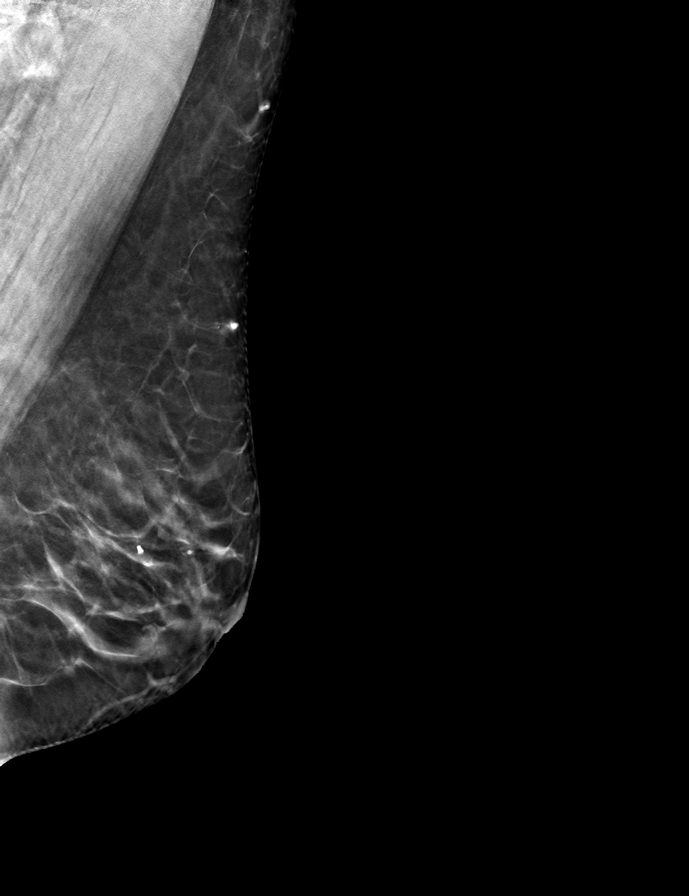

[R MLO tomo · tomo slice 34/67.0]
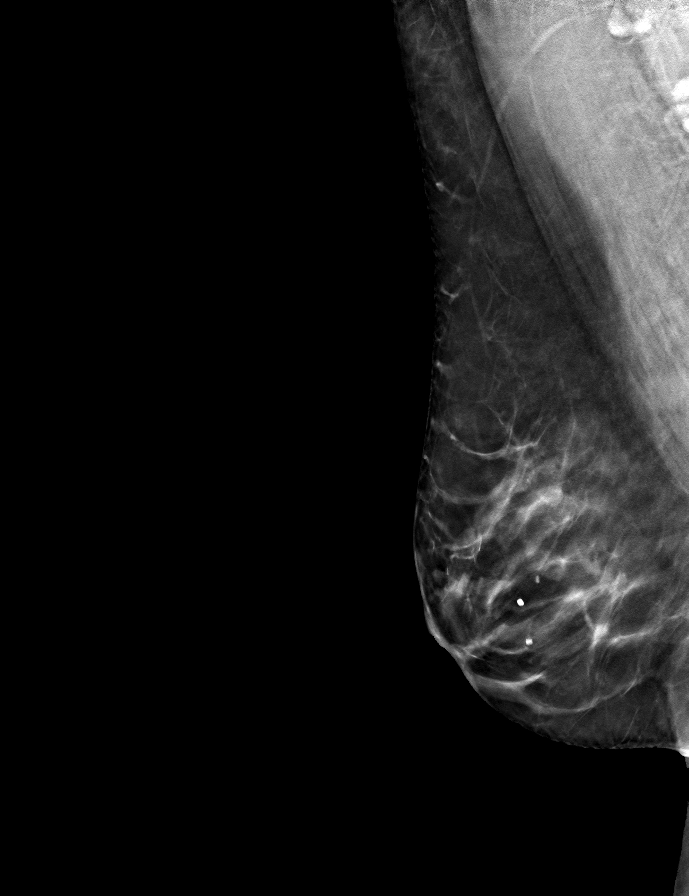

[R CC tomo · tomo slice 30/59.0]
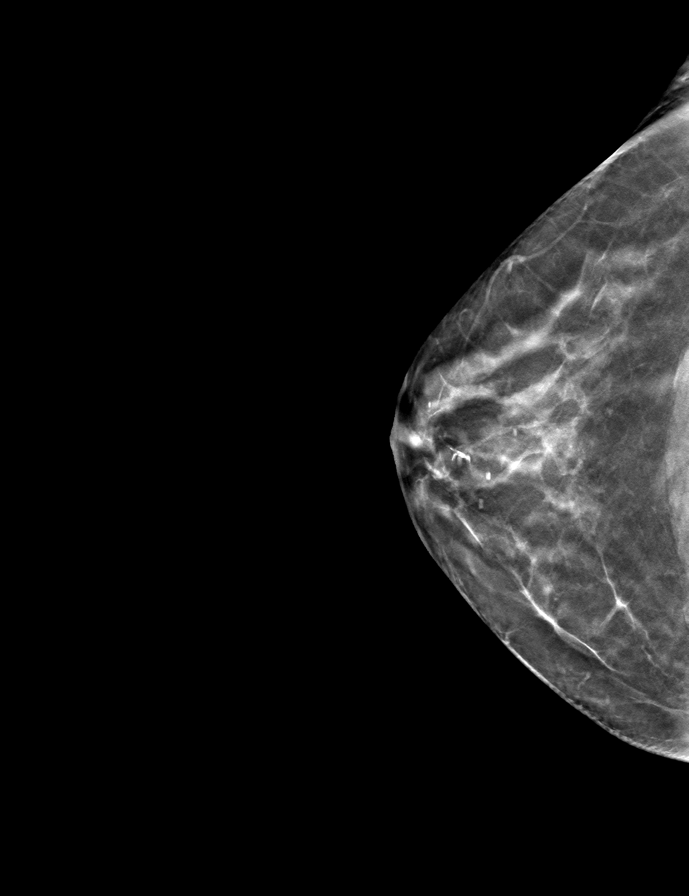

[L CC tomo · tomo slice 29/57.0]
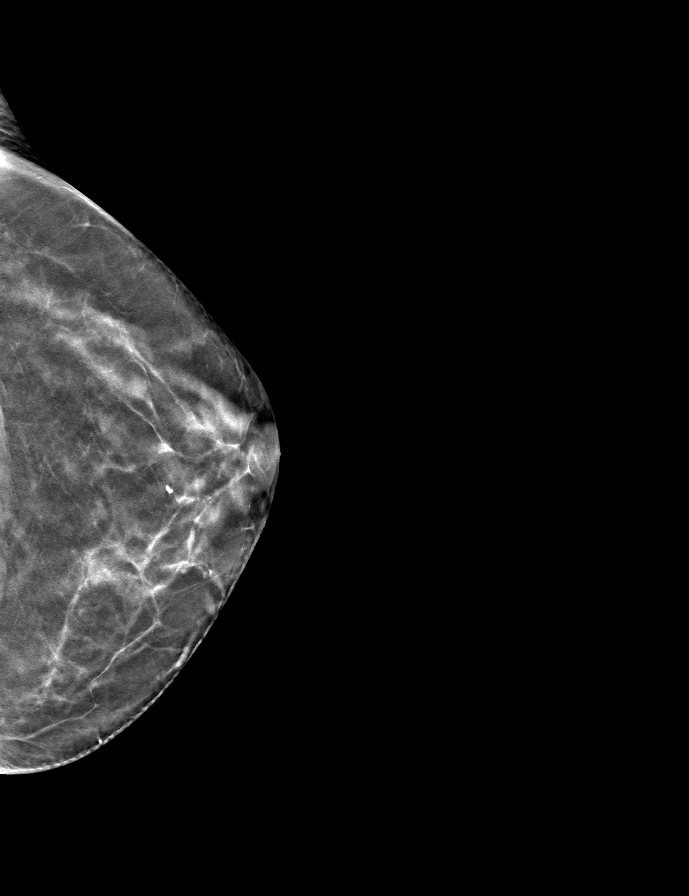

[9 of 24 positions shown; findings below may reference images not displayed]

ACR Breast Density Category b: There are scattered areas of
fibroglandular density.
FINDINGS: There are no findings suspicious for malignancy.
IMPRESSION: No mammographic evidence of malignancy. A result letter of this
screening mammogram will be mailed directly to the patient.

RECOMMENDATION:
Screening mammogram in one year. (Code:51-O-LD2)

BI-RADS CATEGORY  1: Negative.

## 2022-08-05 ENCOUNTER — Other Ambulatory Visit: Payer: Self-pay | Admitting: Family Medicine

## 2022-08-05 DIAGNOSIS — Z1231 Encounter for screening mammogram for malignant neoplasm of breast: Secondary | ICD-10-CM

## 2022-09-02 ENCOUNTER — Ambulatory Visit (INDEPENDENT_AMBULATORY_CARE_PROVIDER_SITE_OTHER): Payer: BLUE CROSS/BLUE SHIELD

## 2022-09-02 DIAGNOSIS — Z1231 Encounter for screening mammogram for malignant neoplasm of breast: Secondary | ICD-10-CM | POA: Diagnosis not present
